# Patient Record
Sex: Male | Born: 1980 | Race: White | Hispanic: No | Marital: Single | Smoking: Current every day smoker
Health system: Southern US, Community
[De-identification: ages and names within clinical notes are randomized; demographics above are authoritative.]

## PROBLEM LIST (undated history)

## (undated) DIAGNOSIS — L409 Psoriasis, unspecified: Secondary | ICD-10-CM

## (undated) HISTORY — PX: NECK SURGERY: SHX720

---

## 2018-08-07 ENCOUNTER — Encounter (HOSPITAL_COMMUNITY): Payer: Self-pay | Admitting: Family Medicine

## 2018-08-07 ENCOUNTER — Emergency Department (HOSPITAL_COMMUNITY): Payer: Self-pay

## 2018-08-07 ENCOUNTER — Other Ambulatory Visit: Payer: Self-pay

## 2018-08-07 ENCOUNTER — Emergency Department (HOSPITAL_COMMUNITY)
Admission: EM | Admit: 2018-08-07 | Discharge: 2018-08-08 | Disposition: A | Discharge status: Home / Self Care | Payer: Self-pay | Attending: Emergency Medicine | Admitting: Emergency Medicine

## 2018-08-07 DIAGNOSIS — Z5321 Procedure and treatment not carried out due to patient leaving prior to being seen by health care provider: Secondary | ICD-10-CM | POA: Insufficient documentation

## 2018-08-07 HISTORY — DX: Psoriasis, unspecified: L40.9

## 2018-08-07 NOTE — ED Triage Notes (Signed)
Patient is complaining of dental pain of the upper left side and left wrist swelling and pain. Dental pain started about 1-2 weeks ago and the wrist injury occurred 2 days ago. He states he was playing around. Denies any fever at home. Reports he lives in [place] and visiting a friend.

## 2018-08-08 NOTE — ED Notes (Signed)
Informed patient left before being evaluated while in another patients room.

## 2018-08-08 NOTE — ED Notes (Signed)
Pt was walking towards the door and writer asked pt if he was leaving. Pt stated "Yes, this is the worst service I've ever had" and walked out. Di Kindle, RN notified.

## 2020-10-02 IMAGING — DX LEFT WRIST - COMPLETE 3+ VIEW
4 series · 4 of 4 positions shown · non-contrast
Comparison: None.

CLINICAL DATA: Left wrist injury 2 days ago.  Swelling and pain.

EXAM:
LEFT WRIST - COMPLETE 3+ VIEW

[wrist ap (1 of 2)]
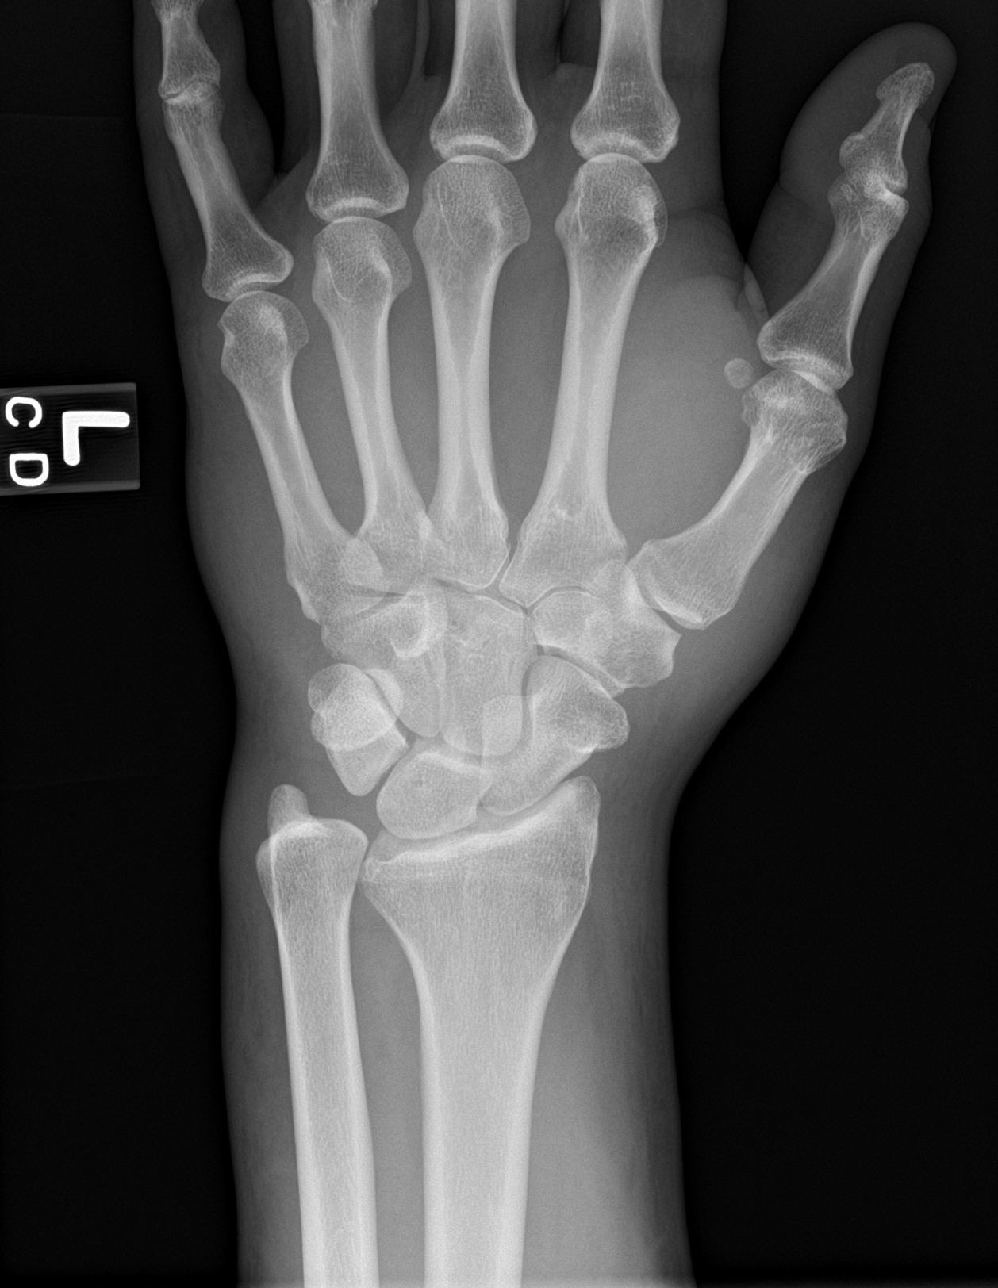

[wrist obl]
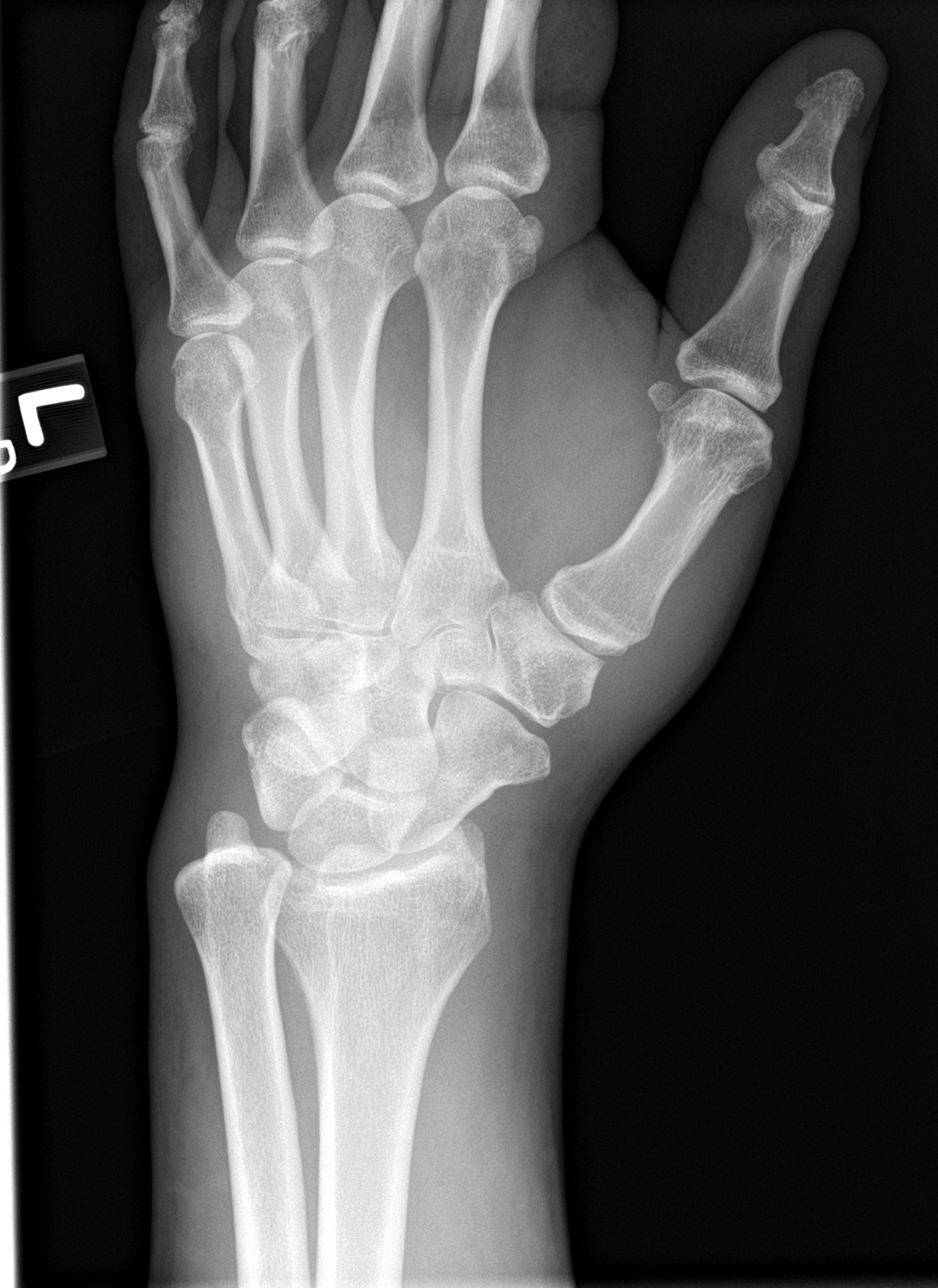

[wrist tunnel]
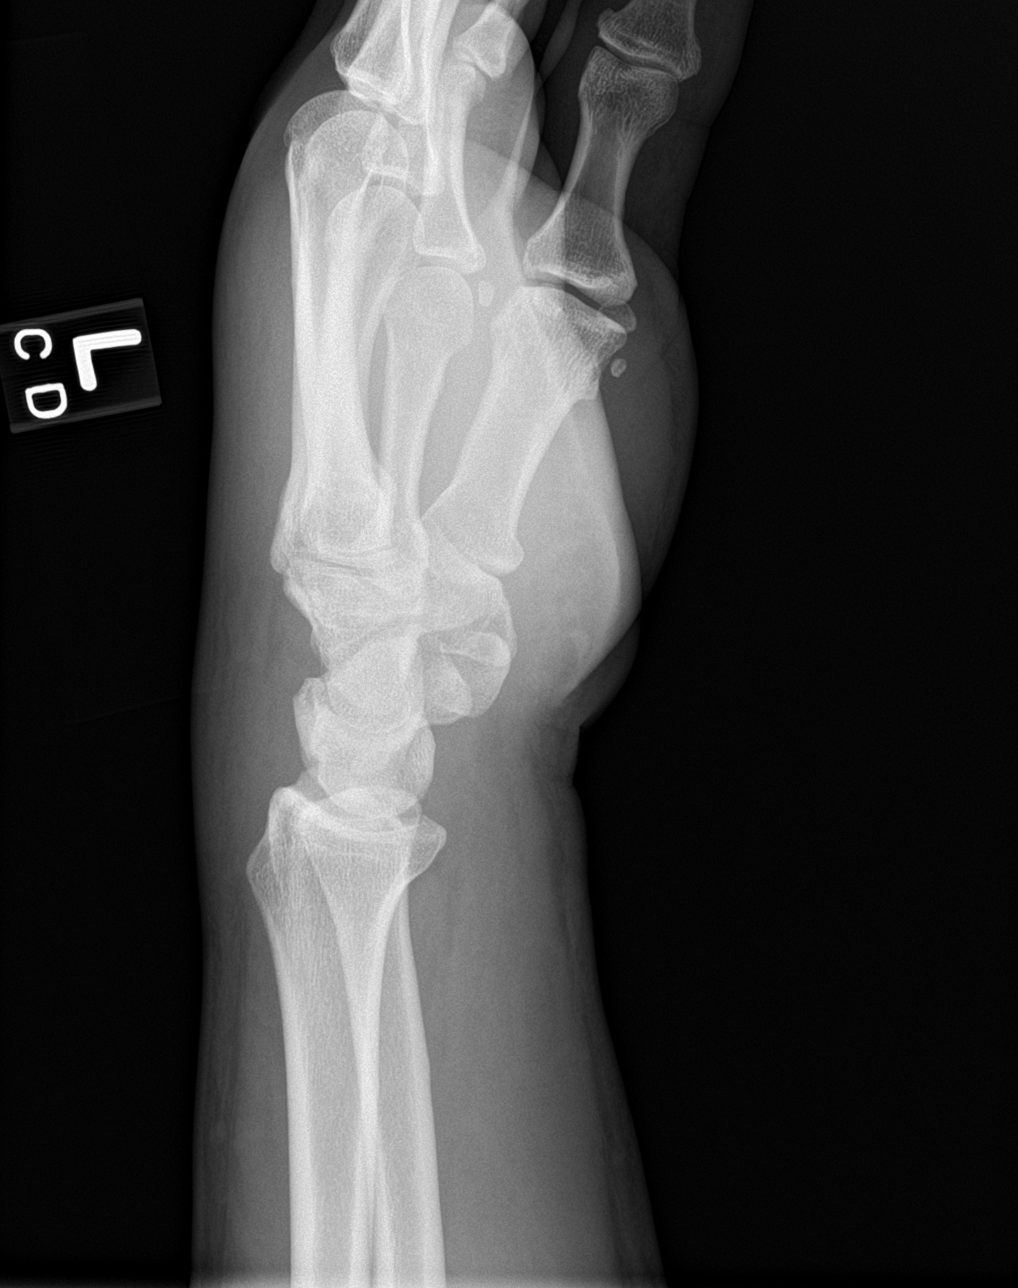

[wrist ap (2 of 2)]
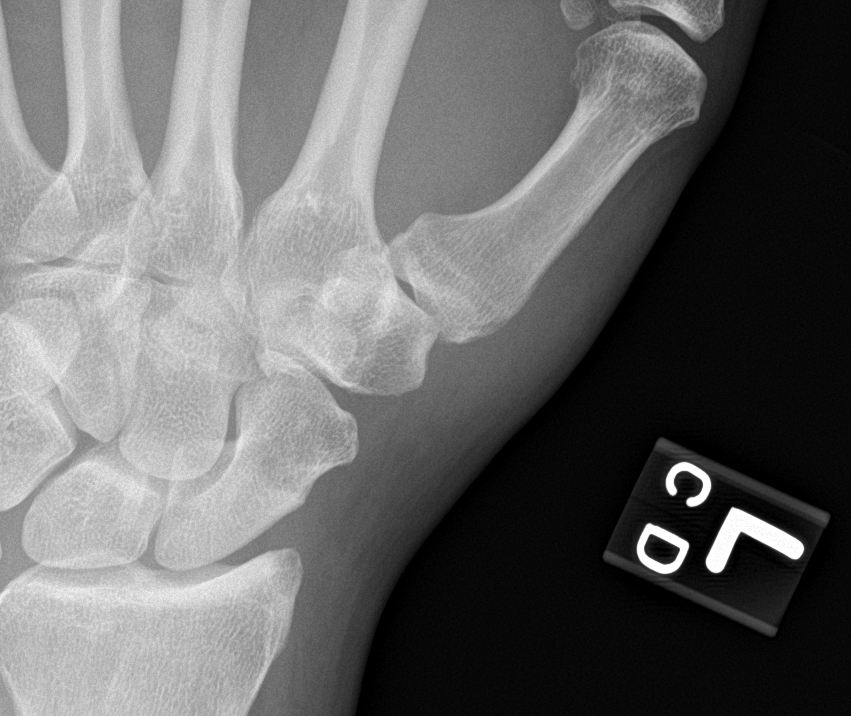

[4 of 4 positions shown; findings below may reference images not displayed]

FINDINGS: There is no evidence of fracture or dislocation. There is no
evidence of arthropathy or other focal bone abnormality. Moderate
soft tissue swelling.
IMPRESSION: Soft tissue swelling without fracture or dislocation of the left
wrist.
